# Patient Record
Sex: Male | Born: 1985 | Race: White | Hispanic: No
Health system: Southern US, Community
[De-identification: ages and names within clinical notes are randomized; demographics above are authoritative.]

---

## 2015-02-13 DIAGNOSIS — F419 Anxiety disorder, unspecified: Secondary | ICD-10-CM | POA: Insufficient documentation

## 2019-02-18 DIAGNOSIS — Q992 Fragile X chromosome: Secondary | ICD-10-CM | POA: Insufficient documentation

## 2021-09-24 ENCOUNTER — Other Ambulatory Visit: Payer: Self-pay

## 2021-09-24 ENCOUNTER — Ambulatory Visit (INDEPENDENT_AMBULATORY_CARE_PROVIDER_SITE_OTHER): Payer: 59 | Admitting: Podiatry

## 2021-09-24 DIAGNOSIS — L600 Ingrowing nail: Secondary | ICD-10-CM

## 2021-09-24 DIAGNOSIS — B351 Tinea unguium: Secondary | ICD-10-CM

## 2021-09-24 DIAGNOSIS — M79674 Pain in right toe(s): Secondary | ICD-10-CM | POA: Diagnosis not present

## 2021-09-24 DIAGNOSIS — M79675 Pain in left toe(s): Secondary | ICD-10-CM | POA: Diagnosis not present

## 2021-09-24 DIAGNOSIS — J3089 Other allergic rhinitis: Secondary | ICD-10-CM | POA: Insufficient documentation

## 2021-09-24 MED ORDER — MUPIROCIN 2 % EX OINT: 1.0000 "application " | TOPICAL_OINTMENT | Freq: Two times a day (BID) | CUTANEOUS | 1 refills | Status: AC

## 2021-09-24 NOTE — Progress Notes (Signed)
   SUBJECTIVE Patient presents to office today complaining of elongated, thickened nails that cause pain while ambulating in shoes.  Patient is unable to trim their own nails. Patient is here for further evaluation and treatment.  No past medical history on file.  OBJECTIVE General Patient is awake, alert, and oriented x 3 and in no acute distress. Derm Skin is dry and supple bilateral. Negative open lesions or macerations. Remaining integument unremarkable. Nails are tender, long, thickened and dystrophic with subungual debris, consistent with onychomycosis, 1-5 bilateral. No signs of infection noted. Vasc  DP and PT pedal pulses palpable bilaterally. Temperature gradient within normal limits.  Neuro Epicritic and protective threshold sensation grossly intact bilaterally.  Musculoskeletal Exam No symptomatic pedal deformities noted bilateral. Muscular strength within normal limits.  ASSESSMENT 1.  Pain due to onychomycosis of toenails both 2.  Mild ingrown toenail lateral border of the left fifth digit  PLAN OF CARE 1. Patient evaluated today.  2. Instructed to maintain good pedal hygiene and foot care.  3. Mechanical debridement of nails 1-5 bilaterally performed using a nail nipper. Filed with dremel without incident.  4.  Prescription for mupirocin ointment to apply to the left fifth toenail daily.  Patient recently finished oral antibiotics  5.  Recommend daily foot soaks x2 weeks  6.  Return to clinic as needed   Felecia Shelling, DPM [facility]  Dr. Felecia Shelling, DPM    [address] [place], [place] [address]                Office [phone]  Fax [phone]

## 2021-10-15 ENCOUNTER — Ambulatory Visit: Payer: Medicare Other | Admitting: Podiatry

## 2021-11-28 ENCOUNTER — Other Ambulatory Visit: Payer: Self-pay | Admitting: Internal Medicine

## 2021-11-28 DIAGNOSIS — R1013 Epigastric pain: Secondary | ICD-10-CM

## 2021-11-29 ENCOUNTER — Ambulatory Visit
Admission: RE | Admit: 2021-11-29 | Discharge: 2021-11-29 | Disposition: A | Discharge status: Home / Self Care | Payer: Medicare Other | Source: Ambulatory Visit | Attending: Internal Medicine | Admitting: Internal Medicine

## 2021-11-29 ENCOUNTER — Other Ambulatory Visit: Payer: Self-pay

## 2021-11-29 DIAGNOSIS — R1013 Epigastric pain: Secondary | ICD-10-CM

## 2021-12-21 ENCOUNTER — Other Ambulatory Visit: Payer: Self-pay | Admitting: Internal Medicine

## 2021-12-21 DIAGNOSIS — R1030 Lower abdominal pain, unspecified: Secondary | ICD-10-CM

## 2022-12-02 IMAGING — US US ABDOMEN COMPLETE
1 series · 14 of 25 positions shown · non-contrast
Comparison: None.

CLINICAL DATA: Epigastric pain

EXAM:
ABDOMEN ULTRASOUND COMPLETE

[Series 1: us abdomen complete · 0.17mm/px · 14 of 94 slices shown]
[im 1/94]
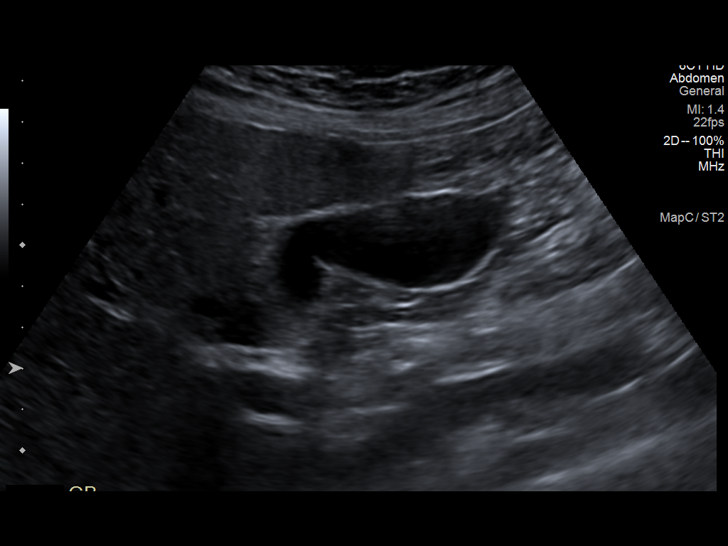
[im 8/94]
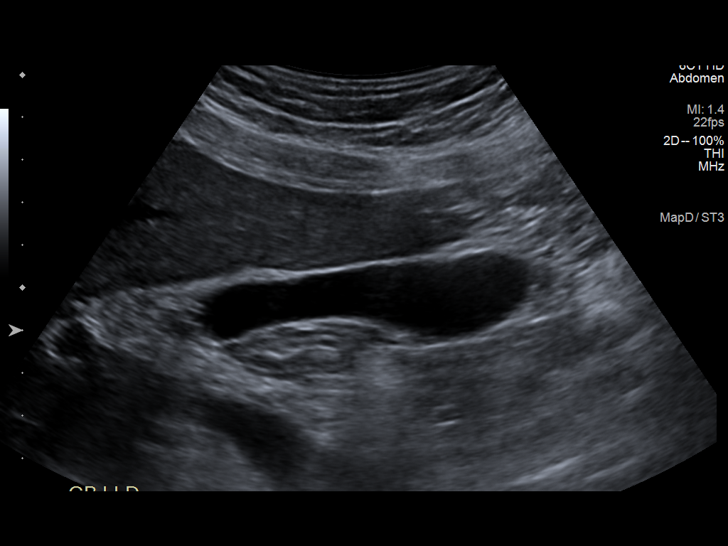
[im 16/94]
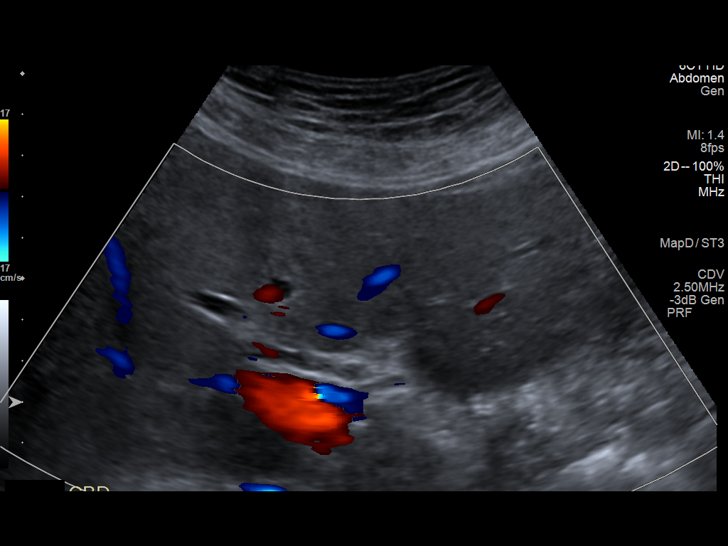
[im 24/94]
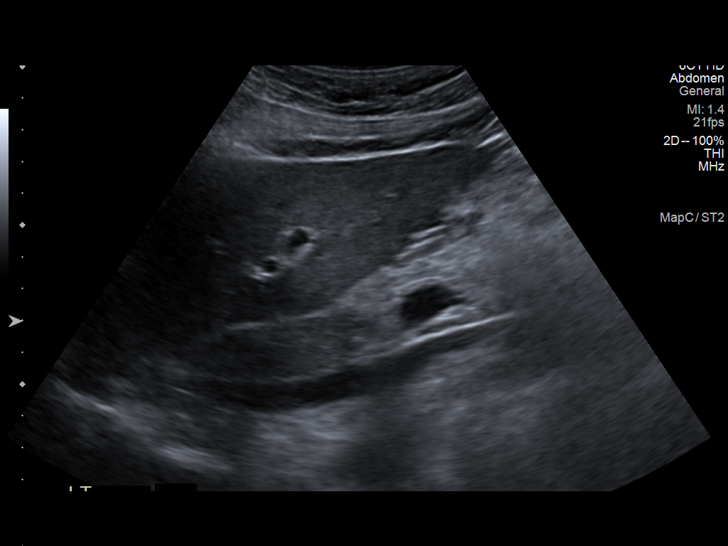
[im 32/94]
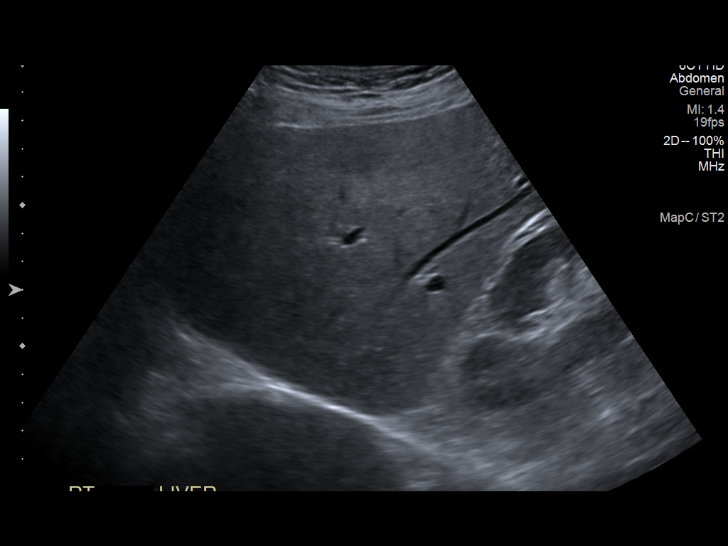
[im 35/94]
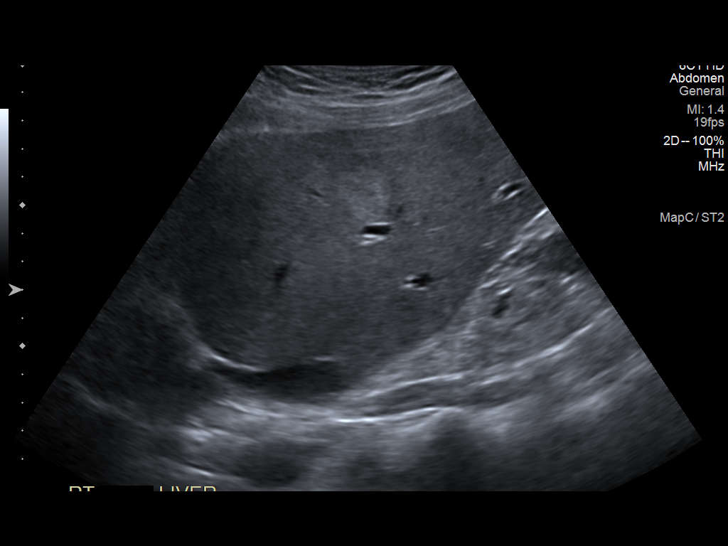
[im 43/94]
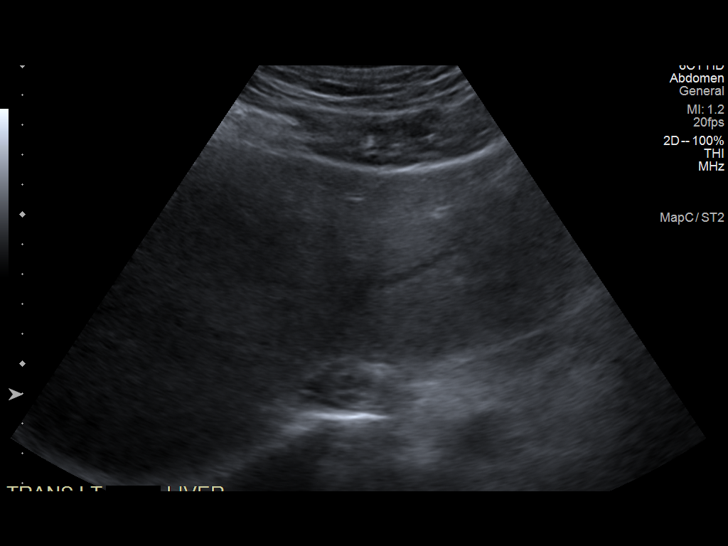
[im 51/94]
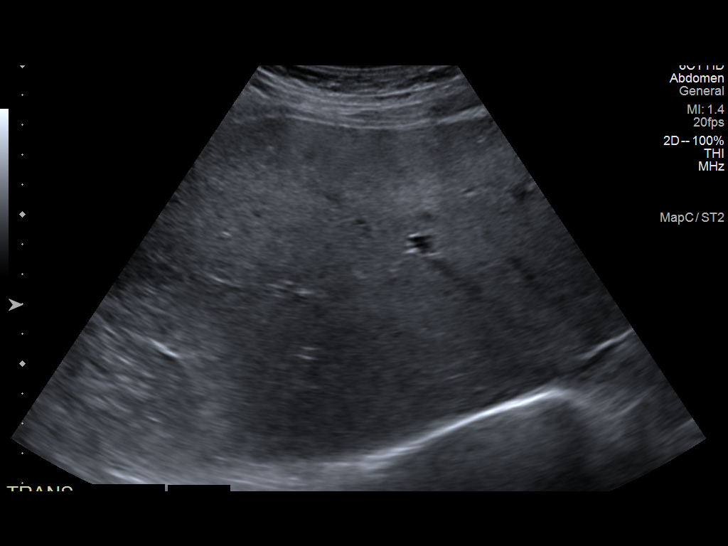
[im 59/94]
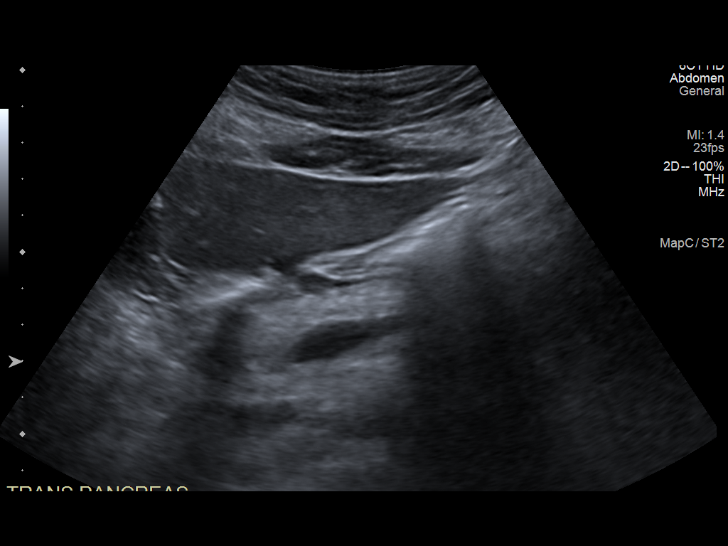
[im 63/94]
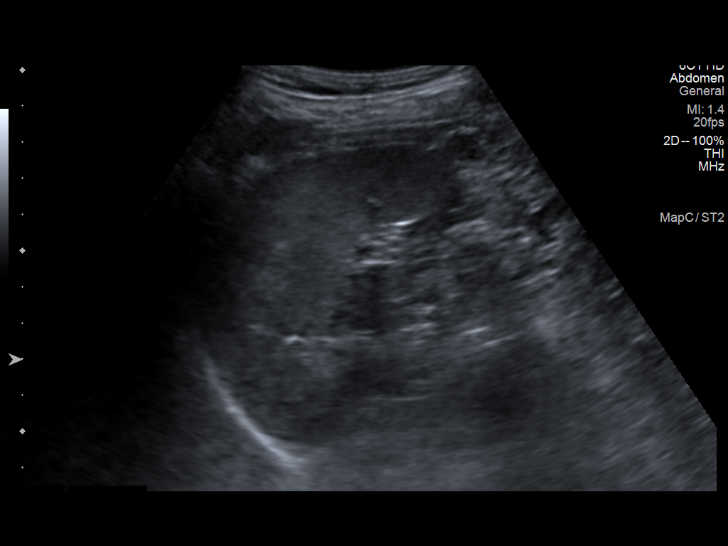
[im 70/94]
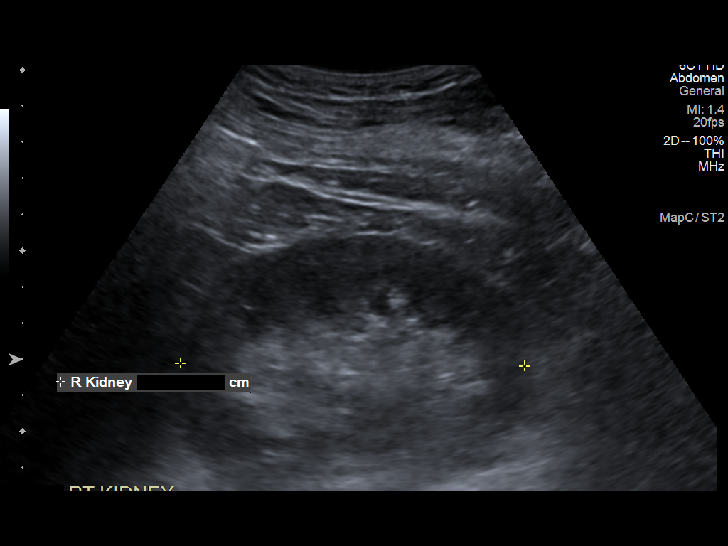
[im 78/94]
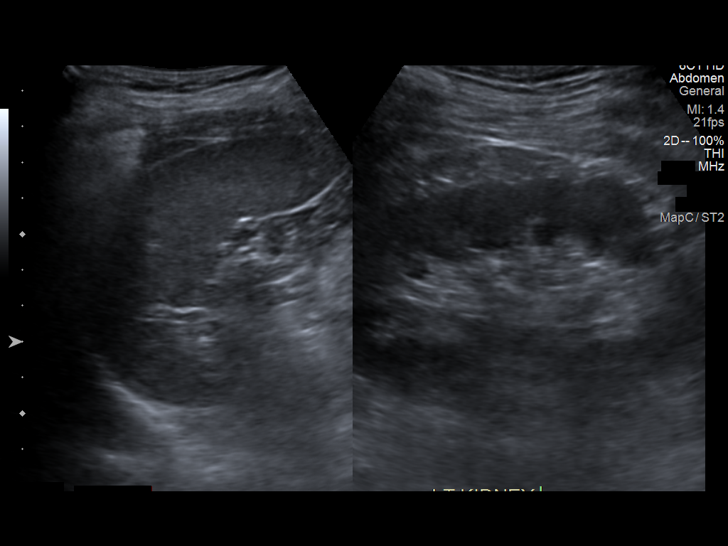
[im 86/94]
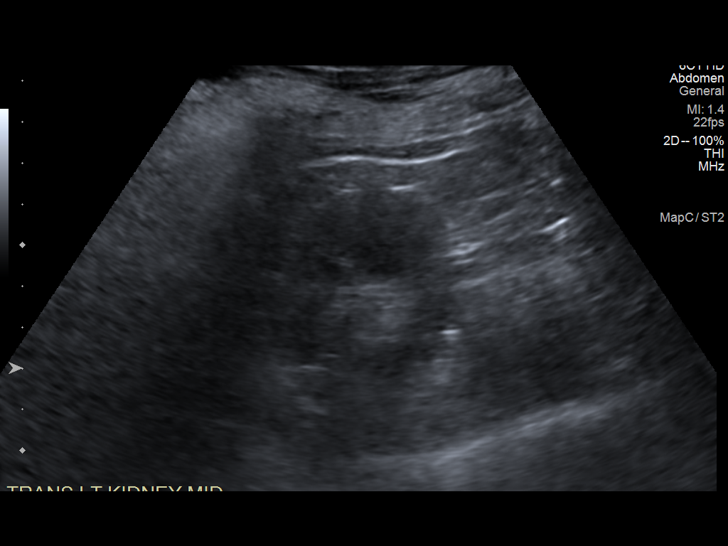
[im 94/94]
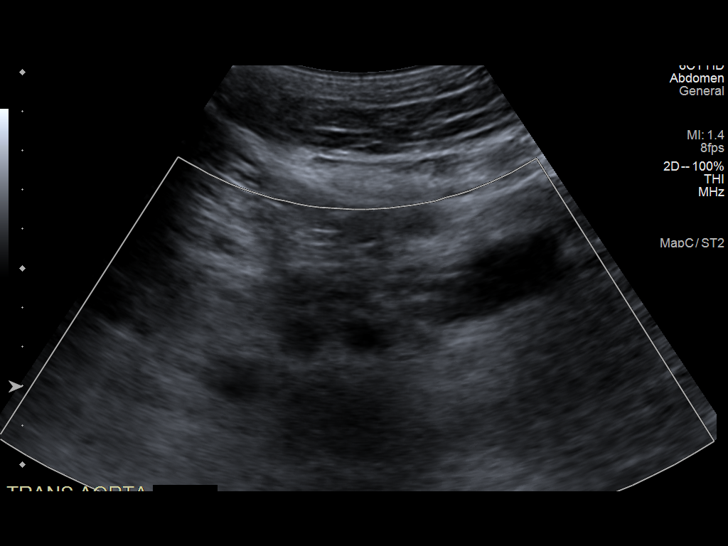

[14 of 25 positions shown; findings below may reference images not displayed]

FINDINGS: Gallbladder: No gallstones or wall thickening visualized. No
sonographic Murphy sign noted by sonographer.

Common bile duct: Diameter: 4 mm, normal.

Liver: 2 areas abnormal echogenicity, 2.9 x 2.1 x 1.5 cm in the
right lobe and 2.3 x 2.3 x 2.0 cm in the inferior right lobe. These
are most consistent with hemangiomas but additional characterization
is recommended as below. Portal vein is patent on color Doppler
imaging with normal direction of blood flow towards the liver.

IVC: No abnormality visualized.

Pancreas: Visualized portion unremarkable.

Spleen: Size and appearance within normal limits.

Right Kidney: Length: 9.5 cm. Echogenicity within normal limits. No
mass or hydronephrosis visualized.

Left Kidney: Length: 10.5 cm. Echogenicity within normal limits. No
mass or hydronephrosis visualized.

Abdominal aorta: No aneurysm visualized.

Other findings: None.
IMPRESSION: No abnormality seen to explain acute pain. Two echogenic areas
within the liver most consistent with hemangiomas. Abdominal MRI
would be suggested for definitive characterization.

## 2024-07-24 DIAGNOSIS — L03011 Cellulitis of right finger: Secondary | ICD-10-CM | POA: Diagnosis not present

## 2024-07-30 DIAGNOSIS — Z23 Encounter for immunization: Secondary | ICD-10-CM | POA: Diagnosis not present

## 2024-07-30 DIAGNOSIS — S6010XA Contusion of unspecified finger with damage to nail, initial encounter: Secondary | ICD-10-CM | POA: Diagnosis not present

## 2024-08-23 DIAGNOSIS — M25512 Pain in left shoulder: Secondary | ICD-10-CM | POA: Diagnosis not present

## 2024-08-24 DIAGNOSIS — M25512 Pain in left shoulder: Secondary | ICD-10-CM | POA: Diagnosis not present

## 2024-08-25 DIAGNOSIS — Z0000 Encounter for general adult medical examination without abnormal findings: Secondary | ICD-10-CM | POA: Diagnosis not present

## 2024-08-25 DIAGNOSIS — Q8789 Other specified congenital malformation syndromes, not elsewhere classified: Secondary | ICD-10-CM | POA: Diagnosis not present

## 2024-08-25 DIAGNOSIS — J309 Allergic rhinitis, unspecified: Secondary | ICD-10-CM | POA: Diagnosis not present

## 2024-08-25 DIAGNOSIS — Z136 Encounter for screening for cardiovascular disorders: Secondary | ICD-10-CM | POA: Diagnosis not present

## 2024-08-25 DIAGNOSIS — E785 Hyperlipidemia, unspecified: Secondary | ICD-10-CM | POA: Diagnosis not present

## 2024-08-25 DIAGNOSIS — J301 Allergic rhinitis due to pollen: Secondary | ICD-10-CM | POA: Diagnosis not present

## 2024-08-31 DIAGNOSIS — M25512 Pain in left shoulder: Secondary | ICD-10-CM | POA: Diagnosis not present
